# Patient Record
Sex: Male | Born: 1935 | Race: Black or African American | Marital: Single | Smoking: Never smoker
Health system: Southern US, Community
[De-identification: ages and names within clinical notes are randomized; demographics above are authoritative.]

---

## 2012-03-06 ENCOUNTER — Ambulatory Visit
Admission: RE | Admit: 2012-03-06 | Discharge: 2012-03-06 | Disposition: A | Discharge status: Home / Self Care | Payer: PRIVATE HEALTH INSURANCE | Source: Ambulatory Visit | Attending: Family Medicine | Admitting: Family Medicine

## 2012-03-06 ENCOUNTER — Other Ambulatory Visit: Payer: Self-pay | Admitting: Family Medicine

## 2012-03-06 DIAGNOSIS — R630 Anorexia: Secondary | ICD-10-CM

## 2012-03-06 DIAGNOSIS — R05 Cough: Secondary | ICD-10-CM

## 2015-03-19 ENCOUNTER — Emergency Department (HOSPITAL_COMMUNITY)
Admission: EM | Admit: 2015-03-19 | Discharge: 2015-03-19 | Disposition: A | Discharge status: Home / Self Care | Payer: Medicare HMO | Attending: Emergency Medicine | Admitting: Emergency Medicine

## 2015-03-19 ENCOUNTER — Encounter (HOSPITAL_COMMUNITY): Payer: Self-pay | Admitting: Emergency Medicine

## 2015-03-19 DIAGNOSIS — R630 Anorexia: Secondary | ICD-10-CM | POA: Insufficient documentation

## 2015-03-19 DIAGNOSIS — R634 Abnormal weight loss: Secondary | ICD-10-CM | POA: Diagnosis not present

## 2015-03-19 DIAGNOSIS — Z87891 Personal history of nicotine dependence: Secondary | ICD-10-CM | POA: Insufficient documentation

## 2015-03-19 DIAGNOSIS — R319 Hematuria, unspecified: Secondary | ICD-10-CM | POA: Diagnosis present

## 2015-03-19 DIAGNOSIS — R310 Gross hematuria: Secondary | ICD-10-CM | POA: Diagnosis not present

## 2015-03-19 LAB — BASIC METABOLIC PANEL
ANION GAP: 12 (ref 5–15)
BUN: 34 mg/dL — AB (ref 6–20)
CHLORIDE: 105 mmol/L (ref 101–111)
CO2: 28 mmol/L (ref 22–32)
Calcium: 9.5 mg/dL (ref 8.9–10.3)
Creatinine, Ser: 1.4 mg/dL — ABNORMAL HIGH (ref 0.61–1.24)
GFR calc Af Amer: 48 mL/min — ABNORMAL LOW (ref >60)
GFR calc non Af Amer: 42 mL/min — ABNORMAL LOW (ref >60)
GLUCOSE: 108 mg/dL — AB (ref 65–99)
POTASSIUM: 3.2 mmol/L — AB (ref 3.5–5.1)
SODIUM: 145 mmol/L (ref 135–145)

## 2015-03-19 LAB — URINE MICROSCOPIC-ADD ON: Squamous Epithelial / LPF: NONE SEEN

## 2015-03-19 LAB — CBC WITH DIFFERENTIAL/PLATELET
Basophils Absolute: 0 10*3/uL (ref 0.0–0.1)
Basophils Relative: 0 %
Eosinophils Absolute: 0 10*3/uL (ref 0.0–0.7)
Eosinophils Relative: 0 %
HCT: 33.8 % — ABNORMAL LOW (ref 39.0–52.0)
HEMOGLOBIN: 11.3 g/dL — AB (ref 13.0–17.0)
LYMPHS ABS: 1.2 10*3/uL (ref 0.7–4.0)
LYMPHS PCT: 28 %
MCH: 31.9 pg (ref 26.0–34.0)
MCHC: 33.4 g/dL (ref 30.0–36.0)
MCV: 95.5 fL (ref 78.0–100.0)
Monocytes Absolute: 0.4 10*3/uL (ref 0.1–1.0)
Monocytes Relative: 9 %
NEUTROS PCT: 63 %
Neutro Abs: 2.7 10*3/uL (ref 1.7–7.7)
Platelets: 227 10*3/uL (ref 150–400)
RBC: 3.54 MIL/uL — AB (ref 4.22–5.81)
RDW: 14.6 % (ref 11.5–15.5)
WBC: 4.2 10*3/uL (ref 4.0–10.5)

## 2015-03-19 LAB — URINALYSIS, ROUTINE W REFLEX MICROSCOPIC
Glucose, UA: NEGATIVE mg/dL
Ketones, ur: NEGATIVE mg/dL
Nitrite: NEGATIVE
Protein, ur: NEGATIVE mg/dL
Specific Gravity, Urine: 1.022 (ref 1.005–1.030)
pH: 5 (ref 5.0–8.0)

## 2015-03-19 NOTE — ED Notes (Signed)
Pt states he got up this morning to get a glass of water and urinate and when he voided it was bloody

## 2015-03-19 NOTE — ED Provider Notes (Signed)
CSN: [id number]     Arrival date & time 03/19/15  0315 History  By signing my name below, I, Adrian Estrada, attest that this documentation has been prepared under the direction and in the presence of Derwood Kaplan, MD. Electronically Signed: [name], ED Scribe. 03/19/2015. 5:27 AM.    Chief Complaint  Patient presents with  . Hematuria   The history is provided by the patient.   HPI Comments: Adrian Estrada is a 81 y.o. male who presents to the Emergency Department complaining of an episode of hematuria that occurred several hours PTA. He reports associated weight loss of approx 20 pounds in the past several months and increase eye puffiness. However, he attributes these symptoms to stress and loss of appetite since his wife has lost her eyesight. He denies any burning while urination, frequency, or dysuria. He reports that he had a normal day, digging and even bowling prior to onset. He denies any medical  hx. He reports that he does not have a PCP but has a physical every 2 years from his wife's doctor. No known prostate problems.    History reviewed. No pertinent past medical history. History reviewed. No pertinent past surgical history. History reviewed. No pertinent family history. Social History  Substance Use Topics  . Smoking status: Former Games developer  . Smokeless tobacco: None  . Alcohol Use: No    Review of Systems   ROS 10 Systems reviewed and are negative for acute change except as noted in the HPI.     Allergies  Review of patient's allergies indicates no known allergies.  Home Medications   Prior to Admission medications   Not on File   BP 151/88 mmHg  Pulse 90  Temp(Src) 97.4 F (36.3 C) (Oral)  Resp 17  SpO2 99% Physical Exam  Constitutional: He is oriented to person, place, and time. He appears well-developed and well-nourished. No distress.  HENT:  Head: Normocephalic and atraumatic.  Eyes: Conjunctivae and EOM are normal.  Neck: Neck  supple. No tracheal deviation present.  Cardiovascular: Normal rate.   Pulmonary/Chest: Effort normal. No respiratory distress.  Abdominal: Soft.  Musculoskeletal: Normal range of motion.  No midline spine tenderness No flank TTP  Neurological: He is alert and oriented to person, place, and time.  Skin: Skin is warm and dry.  Psychiatric: He has a normal mood and affect. His behavior is normal.  Nursing note and vitals reviewed.   ED Course  Procedures (including critical care time) Labs Review Labs Reviewed  URINALYSIS, ROUTINE W REFLEX MICROSCOPIC (NOT AT [facility]) - Abnormal; Notable for the following:    APPearance CLOUDY (*)    Hgb urine dipstick LARGE (*)    Bilirubin Urine SMALL (*)    Leukocytes, UA SMALL (*)    All other components within normal limits  CBC WITH DIFFERENTIAL/PLATELET - Abnormal; Notable for the following:    RBC 3.54 (*)    Hemoglobin 11.3 (*)    HCT 33.8 (*)    All other components within normal limits  BASIC METABOLIC PANEL - Abnormal; Notable for the following:    Potassium 3.2 (*)    Glucose, Bld 108 (*)    BUN 34 (*)    Creatinine, Ser 1.40 (*)    GFR calc non Af Amer 42 (*)    GFR calc Af Amer 48 (*)    All other components within normal limits  URINE MICROSCOPIC-ADD ON - Abnormal; Notable for the following:    Bacteria, UA FEW (*)  All other components within normal limits  URINE CULTURE    Imaging Review No results found. I have personally reviewed and evaluated these images and lab results as part of my medical decision-making.   EKG Interpretation None      MDM   Final diagnoses:  Gross hematuria    Pt comes in with gross hematuria. He is [age], has no medical problems. Will check for infection.  He has no back pain, but does indicate that he has some weight loss. CBC and BMP ordered to ensure kidney function is fine and Hb is normal - and if the US is clear, we will give Urology f/u for further evaluation of  hematuria.     Derwood KaplanAnkit Kongmeng Santoro, MD 03/19/15 [phone]

## 2015-03-19 NOTE — Discharge Instructions (Signed)
Please see the Urologist as requested for the bloody urine, so that they can do further investigation looking for the cause of your symptoms.  Please return to the ER if your symptoms worsen; you have increased pain, fevers, chills, inability to urinate.   Hematuria, Adult Hematuria is blood in your urine. It can be caused by a bladder infection, kidney infection, prostate infection, kidney stone, or cancer of your urinary tract. Infections can usually be treated with medicine, and a kidney stone usually will pass through your urine. If neither of these is the cause of your hematuria, further workup to find out the reason may be needed. It is very important that you tell your health care provider about any blood you see in your urine, even if the blood stops without treatment or happens without causing pain. Blood in your urine that happens and then stops and then happens again can be a symptom of a very serious condition. Also, pain is not a symptom in the initial stages of many urinary cancers. HOME CARE INSTRUCTIONS   Drink lots of fluid, 3-4 quarts a day. If you have been diagnosed with an infection, cranberry juice is especially recommended, in addition to large amounts of water.  Avoid caffeine, tea, and carbonated beverages because they tend to irritate the bladder.  Avoid alcohol because it may irritate the prostate.  Take all medicines as directed by your health care provider.  If you were prescribed an antibiotic medicine, finish it all even if you start to feel better.  If you have been diagnosed with a kidney stone, follow your health care provider's instructions regarding straining your urine to catch the stone.  Empty your bladder often. Avoid holding urine for long periods of time.  After a bowel movement, women should cleanse front to back. Use each tissue only once.  Empty your bladder before and after sexual intercourse if you are a male. SEEK MEDICAL CARE IF:  You  develop back pain.  You have a fever.  You have a feeling of sickness in your stomach (nausea) or vomiting.  Your symptoms are not better in 3 days. Return sooner if you are getting worse. SEEK IMMEDIATE MEDICAL CARE IF:   You develop severe vomiting and are unable to keep the medicine down.  You develop severe back or abdominal pain despite taking your medicines.  You begin passing a large amount of blood or clots in your urine.  You feel extremely weak or faint, or you pass out. MAKE SURE YOU:   Understand these instructions.  Will watch your condition.  Will get help right away if you are not doing well or get worse.   This information is not intended to replace advice given to you by your health care provider. Make sure you discuss any questions you have with your health care provider.   Document Released: 03/20/2005 Document Revised: 04/10/2014 Document Reviewed: 11/18/2012 Elsevier Interactive Patient Education [employer]2016 Elsevier Inc.

## 2015-03-20 LAB — URINE CULTURE: Culture: NO GROWTH

## 2015-08-09 ENCOUNTER — Emergency Department (HOSPITAL_COMMUNITY)
Admission: EM | Admit: 2015-08-09 | Discharge: 2015-08-09 | Disposition: A | Discharge status: Home / Self Care | Payer: Medicare HMO | Attending: Emergency Medicine | Admitting: Emergency Medicine

## 2015-08-09 ENCOUNTER — Encounter (HOSPITAL_COMMUNITY): Payer: Self-pay | Admitting: *Deleted

## 2015-08-09 DIAGNOSIS — B029 Zoster without complications: Secondary | ICD-10-CM | POA: Insufficient documentation

## 2015-08-09 DIAGNOSIS — Z79899 Other long term (current) drug therapy: Secondary | ICD-10-CM | POA: Diagnosis not present

## 2015-08-09 MED ORDER — ACYCLOVIR 400 MG PO TABS: 800.0000 mg | ORAL_TABLET | Freq: Every day | ORAL | Status: AC

## 2015-08-09 NOTE — ED Notes (Signed)
Bed: WA03 Expected date:  Expected time:  Means of arrival:  Comments: 82yr old, Shingles

## 2015-08-09 NOTE — Discharge Instructions (Signed)
You were seen today for a rash. Follow-up with your primary care provider within 2 days regarding your visit to the emergency department today.  Return to the emergency department if you experience fever, chills, uncontrolled pain, nausea, vomiting.  Shingles Shingles, which is also known as herpes zoster, is an infection that causes a painful skin rash and fluid-filled blisters. Shingles is not related to genital herpes, which is a sexually transmitted infection.   Shingles only develops in people who:  Have had chickenpox.  Have received the chickenpox vaccine. (This is rare.) CAUSES Shingles is caused by varicella-zoster virus (VZV). This is the same virus that causes chickenpox. After exposure to VZV, the virus stays in the body in an inactive (dormant) state. Shingles develops if the virus reactivates. This can happen many years after the initial exposure to VZV. It is not known what causes this virus to reactivate. RISK FACTORS People who have had chickenpox or received the chickenpox vaccine are at risk for shingles. Infection is more common in people who:  Are older than age 80.  Have a weakened defense (immune) system, such as those with HIV, AIDS, or cancer.  Are taking medicines that weaken the immune system, such as transplant medicines.  Are under great stress. SYMPTOMS Early symptoms of this condition include itching, tingling, and pain in an area on your skin. Pain may be described as burning, stabbing, or throbbing. A few days or weeks after symptoms start, a painful red rash appears, usually on one side of the body in a bandlike or beltlike pattern. The rash eventually turns into fluid-filled blisters that break open, scab over, and dry up in about 2-3 weeks. At any time during the infection, you may also develop:  A fever.  Chills.  A headache.  An upset stomach. DIAGNOSIS This condition is diagnosed with a skin exam. Sometimes, skin or fluid samples are taken  from the blisters before a diagnosis is made. These samples are examined under a microscope or sent to a lab for testing. TREATMENT There is no specific cure for this condition. Your health care provider will probably prescribe medicines to help you manage pain, recover more quickly, and avoid long-term problems. Medicines may include:  Antiviral drugs.  Anti-inflammatory drugs.  Pain medicines. If the area involved is on your face, you may be referred to a specialist, such as an eye doctor (ophthalmologist) or an ear, nose, and throat (ENT) doctor to help you avoid eye problems, chronic pain, or disability. HOME CARE INSTRUCTIONS Medicines  Take medicines only as directed by your health care provider.  Apply an anti-itch or numbing cream to the affected area as directed by your health care provider. Blister and Rash Care  Take a cool bath or apply cool compresses to the area of the rash or blisters as directed by your health care provider. This may help with pain and itching.  Keep your rash covered with a loose bandage (dressing). Wear loose-fitting clothing to help ease the pain of material rubbing against the rash.  Keep your rash and blisters clean with mild soap and cool water or as directed by your health care provider.  Check your rash every day for signs of infection. These include redness, swelling, and pain that lasts or increases.  Do not pick your blisters.  Do not scratch your rash. General Instructions  Rest as directed by your health care provider.  Keep all follow-up visits as directed by your health care provider. This is important.  Until your blisters scab over, your infection can cause chickenpox in people who have never had it or been vaccinated against it. To prevent this from happening, avoid contact with other people, especially:  Babies.  Pregnant women.  Children who have eczema.  Elderly people who have transplants.  People who have chronic  illnesses, such as leukemia or AIDS. SEEK MEDICAL CARE IF:  Your pain is not relieved with prescribed medicines.  Your pain does not get better after the rash heals.  Your rash looks infected. Signs of infection include redness, swelling, and pain that lasts or increases. SEEK IMMEDIATE MEDICAL CARE IF:  The rash is on your face or nose.  You have facial pain, pain around your eye area, or loss of feeling on one side of your face.  You have ear pain or you have ringing in your ear.  You have loss of taste.  Your condition gets worse.   This information is not intended to replace advice given to you by your health care provider. Make sure you discuss any questions you have with your health care provider.   Document Released: 03/20/2005 Document Revised: 04/10/2014 Document Reviewed: 01/29/2014 Elsevier Interactive Patient Education [employer]

## 2015-08-09 NOTE — ED Provider Notes (Signed)
CSN: [id number]     Arrival date & time 08/09/15  1741 History   First MD Initiated Contact with Patient 08/09/15 1748     Chief Complaint  Patient presents with  . Herpes Zoster     (Consider location/radiation/quality/duration/timing/severity/associated sxs/prior Treatment) HPI   Patient is a 82 year old male with no significant past medical history who presents with a rash for 2 days. He states 3 days ago he began experiencing mild pruritus and tingling of his back and chest and posterior left arm. He states the rash is on his upper left back, posterior left arm, left axilla, and left upper chest. He stated the rash was painful when it first began and now only very mild pain to his back. He has not taking anything for the pain or the rash.  History reviewed. No pertinent past medical history. History reviewed. No pertinent past surgical history. No family history on file. Social History  Substance Use Topics  . Smoking status: Never Smoker   . Smokeless tobacco: None  . Alcohol Use: No    Review of Systems  Constitutional: Negative for fever and chills.  Eyes: Negative for visual disturbance.  Respiratory: Negative for shortness of breath.   Cardiovascular: Negative for chest pain.  Gastrointestinal: Negative for nausea, vomiting and abdominal pain.  Skin: Positive for rash.      Allergies  Review of patient's allergies indicates not on file.  Home Medications   Prior to Admission medications   Medication Sig Start Date End Date Taking? Authorizing Provider  acyclovir (ZOVIRAX) 400 MG tablet Take 2 tablets (800 mg total) by mouth 5 (five) times daily. 08/09/15   Carlitos Bottino L Raeonna Milo, PA   BP 142/84 mmHg  Pulse 98  Temp(Src) 97.5 F (36.4 C) (Oral)  Resp 16  SpO2 100% Physical Exam  Constitutional: Vital signs are normal. No distress.  Very thin but well-appearing elderly man  HENT:  Head: Normocephalic and atraumatic.  Eyes: Conjunctivae are normal.  Cardiovascular:  Normal rate and normal heart sounds.   Pulmonary/Chest: Effort normal and breath sounds normal.  Neurological: He is alert. Coordination normal.  Skin: Rash noted.     Red raised confluent rash with scattered vesicles and scattered excoriated areas, does not cross midline on back or midline on chest appears to be T1, T2, T3 dermatomal distribution.  Psychiatric: He has a normal mood and affect. His behavior is normal.    ED Course  Procedures (including critical care time) Labs Review Labs Reviewed - No data to display  Imaging Review No results found. I have personally reviewed and evaluated these images and lab results as part of my medical decision-making.   EKG Interpretation None      MDM   Final diagnoses:  Shingles rash    Patient with herpes zoster. Patient will be discharged with Acyclovir. Pt states he is not in any pain and does not need pain medication. No signs of secondary infection. No signs of disseminated herpes. Follow up with PCP in 2-3 days. Return precautions discussed. Pt is safe for discharge at this time.           Jerre SimonJessica L Tomica Arseneault, PA 08/09/15 1937  Raeford RazorStephen Kohut, MD 08/13/15 1105

## 2015-08-09 NOTE — ED Notes (Addendum)
Per EMS, pt sent from doctor's for shingles to pectoral and shoulder. Shingles on back are weeping. Pt's doctor is also concerned that pt cannot take care of self and needs home health or assisted living. Pt's daughter's number upon discharge, Clarisa Schoolsatia [phone]. Pt also has friend that helps with patient, Simeon CraftVern [phone].

## 2015-08-10 ENCOUNTER — Encounter (HOSPITAL_COMMUNITY): Payer: Self-pay | Admitting: Emergency Medicine

## 2016-06-20 ENCOUNTER — Emergency Department (HOSPITAL_COMMUNITY)
Admission: EM | Admit: 2016-06-20 | Discharge: 2016-06-21 | Disposition: A | Discharge status: Home / Self Care | Payer: Medicare HMO | Attending: Emergency Medicine | Admitting: Emergency Medicine

## 2016-06-20 ENCOUNTER — Encounter (HOSPITAL_COMMUNITY): Payer: Self-pay | Admitting: *Deleted

## 2016-06-20 ENCOUNTER — Emergency Department (HOSPITAL_COMMUNITY): Payer: Medicare HMO

## 2016-06-20 DIAGNOSIS — S91312A Laceration without foreign body, left foot, initial encounter: Secondary | ICD-10-CM

## 2016-06-20 DIAGNOSIS — Y9301 Activity, walking, marching and hiking: Secondary | ICD-10-CM | POA: Insufficient documentation

## 2016-06-20 DIAGNOSIS — W25XXXA Contact with sharp glass, initial encounter: Secondary | ICD-10-CM | POA: Insufficient documentation

## 2016-06-20 DIAGNOSIS — Z23 Encounter for immunization: Secondary | ICD-10-CM | POA: Insufficient documentation

## 2016-06-20 DIAGNOSIS — Y929 Unspecified place or not applicable: Secondary | ICD-10-CM | POA: Insufficient documentation

## 2016-06-20 DIAGNOSIS — Z79899 Other long term (current) drug therapy: Secondary | ICD-10-CM | POA: Diagnosis not present

## 2016-06-20 DIAGNOSIS — Y999 Unspecified external cause status: Secondary | ICD-10-CM | POA: Diagnosis not present

## 2016-06-20 MED ORDER — CEPHALEXIN 500 MG PO CAPS
500.0000 mg | ORAL_CAPSULE | Freq: Once | ORAL | Status: AC
  Administered 2016-06-21: 500 mg via ORAL
  Filled 2016-06-20: qty 1

## 2016-06-20 MED ORDER — CEPHALEXIN 500 MG PO CAPS
500.0000 mg | ORAL_CAPSULE | Freq: Four times a day (QID) | ORAL | 0 refills | Status: AC
Start: 2016-06-20 — End: ?

## 2016-06-20 MED ORDER — TETANUS-DIPHTH-ACELL PERTUSSIS 5-2.5-18.5 LF-MCG/0.5 IM SUSP
0.5000 mL | Freq: Once | INTRAMUSCULAR | Status: AC
  Administered 2016-06-20: 0.5 mL via INTRAMUSCULAR
  Filled 2016-06-20: qty 0.5

## 2016-06-20 MED ORDER — LIDOCAINE-EPINEPHRINE-TETRACAINE (LET) SOLUTION
3.0000 mL | Freq: Once | NASAL | Status: AC
  Administered 2016-06-20: 3 mL via TOPICAL
  Filled 2016-06-20: qty 3

## 2016-06-20 NOTE — ED Provider Notes (Signed)
[facility]-EMERGENCY DEPT Provider Note   CSN: [id number] Arrival date & time: 06/20/16  2050     History   Chief Complaint Chief Complaint  Patient presents with  . Extremity Laceration    HPI Adrian Estrada is a 83 y.o. male.  HPI   Adrian Estrada is a 83 y.o. male, with a history of Dementia, presenting to the ED with a left foot injury that occurred earlier this evening. Patient's neighbor is at the bedside and states that the patient was walking barefoot and stepped on a piece of glass. Neighbor states he recovered the piece of bloody glass and it appeared to be intact. Patient denies current pain. Unknown tetanus status. Denies numbness, tingling, falls, or any other complaints.      History reviewed. No pertinent past medical history.  There are no active problems to display for this patient.   History reviewed. No pertinent surgical history.     Home Medications    Prior to Admission medications   Medication Sig Start Date End Date Taking? Authorizing Provider  acyclovir (ZOVIRAX) 400 MG tablet Take 2 tablets (800 mg total) by mouth 5 (five) times daily. 08/09/15   Jerre Simon, PA  cephALEXin (KEFLEX) 500 MG capsule Take 1 capsule (500 mg total) by mouth 4 (four) times daily. 06/20/16   Anselm Pancoast, PA-C    Family History No family history on file.  Social History Social History  Substance Use Topics  . Smoking status: Never Smoker  . Smokeless tobacco: Never Used  . Alcohol use No     Allergies   Patient has no known allergies.   Review of Systems Review of Systems  Skin: Positive for wound.  Neurological: Negative for weakness and numbness.     Physical Exam Updated Vital Signs BP 121/67 (BP Location: Right Arm)   Pulse 84   Resp 18   SpO2 99%   Physical Exam  Constitutional: He appears well-developed and well-nourished. No distress.  HENT:  Head: Normocephalic and atraumatic.  Eyes: Conjunctivae are normal.  Neck: Neck supple.   Cardiovascular: Normal rate and regular rhythm.   Pulmonary/Chest: Effort normal.  Musculoskeletal: He exhibits no edema.  Full range of motion in the left foot and ankle.  Neurological: He is alert.  Skin: Skin is warm and dry. He is not diaphoretic.  2 cm semi-crescent laceration to the lateral sole of the left foot. No foreign bodies noted under the skin. Bleeding is controlled.  Psychiatric: He has a normal mood and affect. His behavior is normal.  Nursing note and vitals reviewed.    ED Treatments / Results  Labs (all labs ordered are listed, but only abnormal results are displayed) Labs Reviewed - No data to display  EKG  EKG Interpretation None       Radiology No results found.  Procedures .Marland KitchenLaceration Repair Date/Time: 06/21/2016 12:35 AM Performed by: Anselm Pancoast Authorized by: Anselm Pancoast   Consent:    Consent obtained:  Verbal   Consent given by:  Patient   Risks discussed:  Infection, need for additional repair, pain, poor wound healing and retained foreign body Anesthesia (see MAR for exact dosages):    Anesthesia method:  Topical application and local infiltration   Topical anesthetic:  LET   Local anesthetic:  Lidocaine 2% w/o epi Laceration details:    Location:  Foot   Foot location:  Sole of L foot   Length (cm):  2 Repair type:    Repair  type:  Simple Pre-procedure details:    Preparation:  Patient was prepped and draped in usual sterile fashion Exploration:    Hemostasis achieved with:  LET   Wound exploration: wound explored through full range of motion   Treatment:    Area cleansed with:  Betadine and saline   Amount of cleaning:  Extensive   Irrigation solution:  Sterile saline   Irrigation method:  Syringe Skin repair:    Repair method:  Sutures   Suture size:  4-0   Suture material:  Prolene   Suture technique:  Simple interrupted   Number of sutures:  4 Approximation:    Approximation:  Close Post-procedure details:     Dressing:  Sterile dressing (Post-op shoe for protection)   Patient tolerance of procedure:  Tolerated well, no immediate complications     (including critical care time)  Medications Ordered in ED Medications  lidocaine-EPINEPHrine-tetracaine (LET) solution (3 mLs Topical Given 06/20/16 2328)  Tdap (BOOSTRIX) injection 0.5 mL (0.5 mLs Intramuscular Given 06/20/16 2339)  cephALEXin (KEFLEX) capsule 500 mg (500 mg Oral Given 06/21/16 0013)     Initial Impression / Assessment and Plan / ED Course  I have reviewed the triage vital signs and the nursing notes.  Pertinent labs & imaging results that were available during my care of the patient were reviewed by me and considered in my medical decision making (see chart for details).     Patient presents with a laceration to the sole of the foot. Repaired without immediate complication. Postop shoe given for protection. Antibiotics prescribed due to the location of the wound, the potential for contamination, and the possibility for delayed or poor follow up. Wound care and return precautions discussed. Patient's neighbor states he is accepting the care instructions on behalf of the patient's family. Neighbor voices understanding of all instructions.    Final Clinical Impressions(s) / ED Diagnoses   Final diagnoses:  Laceration of left foot, initial encounter    New Prescriptions Discharge Medication List as of 06/21/2016 12:58 AM    START taking these medications   Details  cephALEXin (KEFLEX) 500 MG capsule Take 1 capsule (500 mg total) by mouth 4 (four) times daily., Starting Tue 06/20/2016, Print         Anselm PancoastShawn C Marjan Rosman, PA-C 06/24/16 0134    Lorre NickAnthony Allen, MD 06/25/16 1430

## 2016-06-20 NOTE — ED Triage Notes (Signed)
Pt has laceration to bottom of foot since stepping on glass tonight. Pt denies pain. Bleeding controlled.

## 2016-06-20 NOTE — Discharge Instructions (Addendum)
Remove the bandage after 24 hours. You must wait at least 8 hours after the wound repair to wash the wound. Clean the wound and surrounding area gently with tap water and mild soap. Rinse well and blot dry. Do not scrub the wound, as this may cause the wound edges to come apart. You may shower, but avoid submerging the wound, such as with a bath or swimming. Clean the wound daily to prevent infection. Do not use cleaners such as hydrogen peroxide or alcohol. Reapplication of a topical antibiotic ointment, such as Neosporin, will decrease scab formation and reduce any scarring. You may use Tylenol, naproxen, or ibuprofen for pain.  Go to your primary care provider in about 3 days for a wound check to assure proper healing. Return to the ED in 12-14 days for suture removal.  Return to the ED sooner should the wound edges come apart or signs of infection arise, such as spreading redness, puffiness/swelling, pus draining from the wound, severe increase in pain, or any other major issues.  Please take all of your antibiotics until finished!   You may develop abdominal discomfort or diarrhea from the antibiotic.  You may help offset this with probiotics which you can buy or get in yogurt. Do not eat or take the probiotics until 2 hours after your antibiotic.

## 2016-07-05 ENCOUNTER — Encounter (HOSPITAL_COMMUNITY): Payer: Self-pay | Admitting: *Deleted

## 2016-07-05 ENCOUNTER — Emergency Department (HOSPITAL_COMMUNITY)
Admission: EM | Admit: 2016-07-05 | Discharge: 2016-07-05 | Disposition: A | Discharge status: Home / Self Care | Payer: Medicare HMO | Attending: Emergency Medicine | Admitting: Emergency Medicine

## 2016-07-05 DIAGNOSIS — Z79899 Other long term (current) drug therapy: Secondary | ICD-10-CM | POA: Insufficient documentation

## 2016-07-05 DIAGNOSIS — Z4802 Encounter for removal of sutures: Secondary | ICD-10-CM | POA: Diagnosis not present

## 2016-07-05 NOTE — ED Provider Notes (Signed)
[facility]-EMERGENCY DEPT Provider Note   CSN: [id number] Arrival date & time: 07/05/16  1421  By signing my name below, I, Modena Jansky, attest that this documentation has been prepared under the direction and in the presence of non-physician practitioner, [address] PA-C. Electronically Signed: Modena Jansky, Scribe. 07/05/2016. 3:07 PM.  History   Chief Complaint Chief Complaint  Patient presents with  . Suture / Staple Removal   The history is provided by the patient and medical records. No language interpreter was used.  Suture / Staple Removal  This is a new problem. The current episode started more than 1 week ago. The problem occurs constantly. The problem has been gradually improving. Pertinent negatives include no chest pain, no abdominal pain and no shortness of breath. Nothing aggravates the symptoms. Nothing relieves the symptoms. He has tried nothing for the symptoms. The treatment provided no relief.   HPI Comments: Adrian Estrada is a 83 y.o. male who presents to the Emergency Department for left foot suture removal. He had stiches placed on the bottom of his left foot on 06/20/16. He stepped on a piece of glass while ambulating barefoot. No current concerns. Denies fevers, chills, drainage, warmth, swelling, redness, red streaking, or other issues with the wound. Also denies CP, SOB, abd pain, N/V/D/C, hematuria, dysuria, myalgias, arthralgias, numbness, tingling, focal weakness, or any other complaints at this time.  PCP: Karie Chimera, MD  History reviewed. No pertinent past medical history.  There are no active problems to display for this patient.   History reviewed. No pertinent surgical history.     Home Medications    Prior to Admission medications   Medication Sig Start Date End Date Taking? Authorizing Provider  acyclovir (ZOVIRAX) 400 MG tablet Take 2 tablets (800 mg total) by mouth 5 (five) times daily. 08/09/15   Jerre Simon, PA  cephALEXin  (KEFLEX) 500 MG capsule Take 1 capsule (500 mg total) by mouth 4 (four) times daily. 06/20/16   Anselm Pancoast, PA-C    Family History No family history on file.  Social History Social History  Substance Use Topics  . Smoking status: Never Smoker  . Smokeless tobacco: Never Used  . Alcohol use No     Allergies   Patient has no known allergies.   Review of Systems Review of Systems  Constitutional: Negative for chills and fever.  Respiratory: Negative for shortness of breath.   Cardiovascular: Negative for chest pain.  Gastrointestinal: Negative for abdominal pain, constipation, diarrhea, nausea and vomiting.  Genitourinary: Negative for dysuria and hematuria.  Musculoskeletal: Negative for arthralgias and myalgias.  Skin: Positive for wound (suture removal). Negative for color change.  Allergic/Immunologic: Negative for immunocompromised state.  Neurological: Negative for weakness and numbness.  Psychiatric/Behavioral: Negative for confusion.  10 Systems reviewed and all are negative for acute change except as noted in the HPI.  Physical Exam Updated Vital Signs BP (!) 141/80 (BP Location: Left Arm)   Pulse 88   Temp 98.2 F (36.8 C) (Oral)   Resp 16   SpO2 95%   Physical Exam  Constitutional: He is oriented to person, place, and time. Vital signs are normal. He appears well-developed and well-nourished.  Non-toxic appearance. No distress.  Afebrile, nontoxic, NAD  HENT:  Head: Normocephalic and atraumatic.  Mouth/Throat: Mucous membranes are normal.  Eyes: Conjunctivae and EOM are normal. Right eye exhibits no discharge. Left eye exhibits no discharge.  Neck: Normal range of motion. Neck supple.  Cardiovascular: Normal rate and intact  distal pulses.   Pulmonary/Chest: Effort normal. No respiratory distress.  Abdominal: Normal appearance. He exhibits no distension.  Musculoskeletal: Normal range of motion.  Neurological: He is alert and oriented to person, place, and  time. He has normal strength. No sensory deficit.  Skin: Skin is warm, dry and intact. No rash noted.  Sole of left foot with 4 sutures intact, well-healed wound, no drainage or erythema, no warmth or swelling, wiggles all digits, strength and sensations grossly intact, distal pulses intact, soft compartments.   Psychiatric: He has a normal mood and affect.  Nursing note and vitals reviewed.    ED Treatments / Results  DIAGNOSTIC STUDIES: Oxygen Saturation is 95% on RA, normal by my interpretation.    COORDINATION OF CARE: 3:11 PM- Pt advised of plan for treatment and pt agrees.  Labs (all labs ordered are listed, but only abnormal results are displayed) Labs Reviewed - No data to display  EKG  EKG Interpretation None       Radiology No results found.  Procedures .Suture Removal Date/Time: 07/05/2016 3:16 PM Performed by: Rhona Raider Authorized by: Rhona Raider   Consent:    Consent obtained:  Verbal   Consent given by:  Patient   Risks discussed:  Pain and wound separation   Alternatives discussed:  Alternative treatment and referral Location:    Location:  Lower extremity   Lower extremity location:  Foot   Foot location:  L foot Procedure details:    Wound appearance:  No signs of infection, good wound healing and clean   Number of sutures removed:  4   Number of staples removed:  4 Post-procedure details:    Patient tolerance of procedure:  Tolerated well, no immediate complications   (including critical care time)  Medications Ordered in ED Medications - No data to display   Initial Impression / Assessment and Plan / ED Course  I have reviewed the triage vital signs and the nursing notes.  Pertinent labs & imaging results that were available during my care of the patient were reviewed by me and considered in my medical decision making (see chart for details).     83 y.o. male here for suture removal, sutures placed in L foot on 06/20/16, good  wound healing, no s/sx of infection, NVI w/soft compartments. 4 sutures removed without incident. Wound care advised, f/up with PCP in 1wk for recheck of wound. I explained the diagnosis and have given explicit precautions to return to the ER including for any other new or worsening symptoms. The patient understands and accepts the medical plan as it's been dictated and I have answered their questions. Discharge instructions concerning home care and prescriptions have been given. The patient is STABLE and is discharged to home in good condition.   I personally performed the services described in this documentation, which was scribed in my presence. The recorded information has been reviewed and is accurate.   Final Clinical Impressions(s) / ED Diagnoses   Final diagnoses:  Visit for suture removal    New Prescriptions New Prescriptions   No medications on file     [address], PA-C 07/05/16 1518    Vanetta Mulders, MD 07/05/16 1800

## 2016-07-05 NOTE — ED Notes (Signed)
Bed: WTR8 Expected date:  Expected time:  Means of arrival:  Comments: 

## 2016-07-05 NOTE — Discharge Instructions (Signed)
Keep wound clean with mild soap and water. Keep area covered with a topical antibiotic ointment and bandage until the scab fully heals. Ice and elevate for any pain relief and swelling. Alternate between Ibuprofen and Tylenol for additional pain relief. Follow up with your primary care doctor in approximately 7 days for recheck. Monitor area for signs of infection to include, but not limited to: increasing pain, spreading redness, drainage/pus, worsening swelling, or fevers. Return to emergency department for emergent changing or worsening symptoms.

## 2016-07-05 NOTE — ED Notes (Signed)
Bed: [facility] Expected date:  Expected time:  Means of arrival:  Comments: 

## 2016-07-05 NOTE — ED Triage Notes (Signed)
Pt here for suture removal from bottom of left foot. Pt has had stitches since 3/20. Pt denies any signs of infection to suture site.

## 2016-09-30 ENCOUNTER — Emergency Department (HOSPITAL_COMMUNITY)
Admission: EM | Admit: 2016-09-30 | Discharge: 2016-09-30 | Disposition: A | Discharge status: Home / Self Care | Payer: Medicare HMO | Attending: Emergency Medicine | Admitting: Emergency Medicine

## 2016-09-30 DIAGNOSIS — R404 Transient alteration of awareness: Secondary | ICD-10-CM | POA: Insufficient documentation

## 2016-09-30 DIAGNOSIS — E860 Dehydration: Secondary | ICD-10-CM | POA: Insufficient documentation

## 2016-09-30 DIAGNOSIS — D649 Anemia, unspecified: Secondary | ICD-10-CM | POA: Diagnosis not present

## 2016-09-30 DIAGNOSIS — R55 Syncope and collapse: Secondary | ICD-10-CM | POA: Diagnosis present

## 2016-09-30 LAB — CBC WITH DIFFERENTIAL/PLATELET
BASOS ABS: 0 10*3/uL (ref 0.0–0.1)
BASOS PCT: 0 %
Eosinophils Absolute: 0 10*3/uL (ref 0.0–0.7)
Eosinophils Relative: 1 %
HEMATOCRIT: 27.2 % — AB (ref 39.0–52.0)
HEMOGLOBIN: 9 g/dL — AB (ref 13.0–17.0)
Lymphocytes Relative: 19 %
Lymphs Abs: 0.7 10*3/uL (ref 0.7–4.0)
MCH: 31.4 pg (ref 26.0–34.0)
MCHC: 33.1 g/dL (ref 30.0–36.0)
MCV: 94.8 fL (ref 78.0–100.0)
Monocytes Absolute: 0.4 10*3/uL (ref 0.1–1.0)
Monocytes Relative: 11 %
NEUTROS ABS: 2.7 10*3/uL (ref 1.7–7.7)
Neutrophils Relative %: 69 %
Platelets: 164 10*3/uL (ref 150–400)
RBC: 2.87 MIL/uL — ABNORMAL LOW (ref 4.22–5.81)
RDW: 15.5 % (ref 11.5–15.5)
WBC: 3.9 10*3/uL — ABNORMAL LOW (ref 4.0–10.5)

## 2016-09-30 LAB — BASIC METABOLIC PANEL
ANION GAP: 6 (ref 5–15)
BUN: 18 mg/dL (ref 6–20)
CHLORIDE: 113 mmol/L — AB (ref 101–111)
CO2: 25 mmol/L (ref 22–32)
Calcium: 8.3 mg/dL — ABNORMAL LOW (ref 8.9–10.3)
Creatinine, Ser: 1.32 mg/dL — ABNORMAL HIGH (ref 0.61–1.24)
GFR calc non Af Amer: 44 mL/min — ABNORMAL LOW (ref >60)
GFR, EST AFRICAN AMERICAN: 52 mL/min — AB (ref >60)
Glucose, Bld: 115 mg/dL — ABNORMAL HIGH (ref 65–99)
Potassium: 3.2 mmol/L — ABNORMAL LOW (ref 3.5–5.1)
Sodium: 144 mmol/L (ref 135–145)

## 2016-09-30 LAB — I-STAT CHEM 8, ED
BUN: 20 mg/dL (ref 6–20)
CREATININE: 1.2 mg/dL (ref 0.61–1.24)
Calcium, Ion: 1.17 mmol/L (ref 1.15–1.40)
Chloride: 108 mmol/L (ref 101–111)
Glucose, Bld: 105 mg/dL — ABNORMAL HIGH (ref 65–99)
HEMATOCRIT: 24 % — AB (ref 39.0–52.0)
HEMOGLOBIN: 8.2 g/dL — AB (ref 13.0–17.0)
Potassium: 3.1 mmol/L — ABNORMAL LOW (ref 3.5–5.1)
Sodium: 146 mmol/L — ABNORMAL HIGH (ref 135–145)
TCO2: 27 mmol/L (ref 0–100)

## 2016-09-30 MED ORDER — SODIUM CHLORIDE 0.9 % IV BOLUS (SEPSIS)
1000.0000 mL | Freq: Once | INTRAVENOUS | Status: AC
  Administered 2016-09-30: 1000 mL via INTRAVENOUS

## 2016-09-30 NOTE — ED Triage Notes (Signed)
Patient was out in his yard, he yelled and lay on the ground.  FD reports patient was hypotensive.  EMS BP - 113/80.  200 ml fluid bolus per EMS.  EMS reports that patient has done this before.  GCS on EMS arrival - 9.  [facility]CS enroute to ED - 15. IV placed in left AC by EMS.

## 2016-09-30 NOTE — ED Provider Notes (Signed)
[facility]-EMERGENCY DEPT Provider Note   CSN: [id number] Arrival date & time: 09/30/16  1546     History   Chief Complaint Chief Complaint  Patient presents with  . Loss of Consciousness    HPI Adrian Estrada is a 83 y.o. male.  83 yo M with a chief complaint of a syncopal event. The family states for the past year he's been having bouts of paranoia as well as intermittent confusion and anger. The patient has had a syncopal event today. Per the family he was out in the yard and standing behind a tree healing that someone was coming to get him. When they took him back to the house he became weak and went to the ground. He was in assisted fall and there is no injury per the family. There is no focal shaking or eye deviation. He was unresponsive for approximately 2 minutes. Not confused on arousal. This is the second one of these events. He had one about a month ago at the funeral of his wife. They deny any change in diet any new medications. Denies fevers chills abdominal pain vomiting or diarrhea.   The history is provided by the patient, a relative and a friend.  Loss of Consciousness   This is a recurrent problem. The current episode started less than 1 hour ago. The problem occurs rarely. The problem has been resolved. He lost consciousness for a period of 1 to 5 minutes. The problem is associated with normal activity. Pertinent negatives include abdominal pain, chest pain, confusion, congestion, fever, headaches, palpitations and vomiting. He has tried nothing for the symptoms. The treatment provided no relief.    No past medical history on file.  There are no active problems to display for this patient.   No past surgical history on file.     Home Medications    Prior to Admission medications   Medication Sig Start Date End Date Taking? Authorizing Provider  acyclovir (ZOVIRAX) 400 MG tablet Take 2 tablets (800 mg total) by mouth 5 (five) times daily. Patient not taking:  Reported on 09/30/2016 08/09/15   Jerre Simon, PA  cephALEXin (KEFLEX) 500 MG capsule Take 1 capsule (500 mg total) by mouth 4 (four) times daily. Patient not taking: Reported on 09/30/2016 06/20/16   Anselm Pancoast, PA-C    Family History No family history on file.  Social History Social History  Substance Use Topics  . Smoking status: Never Smoker  . Smokeless tobacco: Never Used  . Alcohol use No     Allergies   Patient has no known allergies.   Review of Systems Review of Systems  Constitutional: Positive for activity change. Negative for chills and fever.  HENT: Negative for congestion and facial swelling.   Eyes: Negative for discharge and visual disturbance.  Respiratory: Negative for shortness of breath.   Cardiovascular: Positive for syncope. Negative for chest pain and palpitations.  Gastrointestinal: Negative for abdominal pain, diarrhea and vomiting.  Musculoskeletal: Negative for arthralgias and myalgias.  Skin: Negative for color change and rash.  Neurological: Positive for syncope. Negative for tremors and headaches.  Psychiatric/Behavioral: Negative for confusion and dysphoric mood.     Physical Exam Updated Vital Signs BP 122/66 (BP Location: Right Arm)   Pulse 84   Resp (!) 21   SpO2 97%   Physical Exam  Constitutional: He is oriented to person, place, and time. He appears cachectic.  HENT:  Head: Normocephalic and atraumatic.  Eyes: EOM are normal. Pupils are  equal, round, and reactive to light.  Neck: Normal range of motion. Neck supple. No JVD present.  Cardiovascular: Normal rate and regular rhythm.  Exam reveals no gallop and no friction rub.   No murmur heard. Pulmonary/Chest: No respiratory distress. He has no wheezes.  Abdominal: He exhibits no distension and no mass. There is no tenderness. There is no rebound and no guarding.  Musculoskeletal: Normal range of motion.  Neurological: He is alert and oriented to person, place, and time.    Skin: No rash noted. No pallor.  Psychiatric: He has a normal mood and affect. His behavior is normal.  Nursing note and vitals reviewed.    ED Treatments / Results  Labs (all labs ordered are listed, but only abnormal results are displayed) Labs Reviewed  CBC WITH DIFFERENTIAL/PLATELET - Abnormal; Notable for the following:       Result Value   WBC 3.9 (*)    RBC 2.87 (*)    Hemoglobin 9.0 (*)    HCT 27.2 (*)    All other components within normal limits  BASIC METABOLIC PANEL - Abnormal; Notable for the following:    Potassium 3.2 (*)    Chloride 113 (*)    Glucose, Bld 115 (*)    Creatinine, Ser 1.32 (*)    Calcium 8.3 (*)    GFR calc non Af Amer 44 (*)    GFR calc Af Amer 52 (*)    All other components within normal limits  I-STAT CHEM 8, ED - Abnormal; Notable for the following:    Sodium 146 (*)    Potassium 3.1 (*)    Glucose, Bld 105 (*)    Hemoglobin 8.2 (*)    HCT 24.0 (*)    All other components within normal limits    EKG  EKG Interpretation  Date/Time:  Saturday September 30 2016 15:46:55 EDT Ventricular Rate:  86 PR Interval:    QRS Duration: 78 QT Interval:  378 QTC Calculation: 453 R Axis:   66 Text Interpretation:  Sinus rhythm Atrial premature complex Anteroseptal infarct, age indeterminate No STEMI. No old tracing for comparison.  Confirmed by Alona BeneLong, Joshua [phone]([id number]) on 09/30/2016 3:55:59 PM       Radiology No results found.  Procedures Procedures (including critical care time)  Medications Ordered in ED Medications  sodium chloride 0.9 % bolus 1,000 mL (0 mLs Intravenous Stopped 09/30/16 1713)     Initial Impression / Assessment and Plan / ED Course  I have reviewed the triage vital signs and the nursing notes.  Pertinent labs & imaging results that were available during my care of the patient were reviewed by me and considered in my medical decision making (see chart for details).     83 yo M With a chief complaint of a syncopal event.  On my arrival to the room the patient is noncommunicative. Family states that this been going on for the past year he left bouts where he is at his baseline and then will be nonresponsive. Or angry. When I talked to the patient he told me that he had nothing to offer me. He didn't provide any further history. I discussed possible causes of these symptoms the family. Currently they feel like he is much better wanting to just take him home. I talked him into doing some basic labs. We'll give them neurology follow-up in case this is subclinical seizures.   Workup with anemia slightly worsening since 2 years ago. Family denies dark stool or blood  in the stool. Declining rectal exam at this time. PCP follow-up early next week.  6:29 PM:  I have discussed the diagnosis/risks/treatment options with the patient and family and believe the pt to be eligible for discharge home to follow-up with PCP. We also discussed returning to the ED immediately if new or worsening sx occur. We discussed the sx which are most concerning (e.g., sudden worsening pain, fever, inability to tolerate by mouth) that necessitate immediate return. Medications administered to the patient during their visit and any new prescriptions provided to the patient are listed below.  Medications given during this visit Medications  sodium chloride 0.9 % bolus 1,000 mL (0 mLs Intravenous Stopped 09/30/16 1713)     The patient appears reasonably screen and/or stabilized for discharge and I doubt any other medical condition or other [facility] requiring further screening, evaluation, or treatment in the ED at this time prior to discharge.   Final Clinical Impressions(s) / ED Diagnoses   Final diagnoses:  Syncope and collapse  Anemia, unspecified type  Dehydration  Altered awareness, transient    New Prescriptions Discharge Medication List as of 09/30/2016  5:57 PM       Melene Plan, DO 09/30/16 1829

## 2016-10-02 ENCOUNTER — Encounter: Payer: Self-pay | Admitting: Neurology

## 2016-11-20 ENCOUNTER — Ambulatory Visit: Payer: Self-pay | Admitting: Neurology

## 2017-01-29 ENCOUNTER — Ambulatory Visit: Payer: Self-pay | Admitting: Neurology

## 2017-03-25 ENCOUNTER — Emergency Department (HOSPITAL_COMMUNITY)
Admission: EM | Admit: 2017-03-25 | Discharge: 2017-03-25 | Disposition: A | Discharge status: Home / Self Care | Payer: Medicare HMO | Attending: Emergency Medicine | Admitting: Emergency Medicine

## 2017-03-25 ENCOUNTER — Encounter (HOSPITAL_COMMUNITY): Payer: Self-pay | Admitting: Emergency Medicine

## 2017-03-25 ENCOUNTER — Emergency Department (HOSPITAL_COMMUNITY): Payer: Medicare HMO

## 2017-03-25 DIAGNOSIS — R4182 Altered mental status, unspecified: Secondary | ICD-10-CM | POA: Diagnosis not present

## 2017-03-25 LAB — COMPREHENSIVE METABOLIC PANEL
ALBUMIN: 3.3 g/dL — AB (ref 3.5–5.0)
ALK PHOS: 75 U/L (ref 38–126)
ALT: 13 U/L — AB (ref 17–63)
ANION GAP: 11 (ref 5–15)
AST: 24 U/L (ref 15–41)
BILIRUBIN TOTAL: 1 mg/dL (ref 0.3–1.2)
BUN: 22 mg/dL — AB (ref 6–20)
CALCIUM: 8.7 mg/dL — AB (ref 8.9–10.3)
CO2: 27 mmol/L (ref 22–32)
CREATININE: 1.45 mg/dL — AB (ref 0.61–1.24)
Chloride: 105 mmol/L (ref 101–111)
GFR calc Af Amer: 46 mL/min — ABNORMAL LOW (ref >60)
GFR calc non Af Amer: 39 mL/min — ABNORMAL LOW (ref >60)
GLUCOSE: 63 mg/dL — AB (ref 65–99)
Potassium: 3 mmol/L — ABNORMAL LOW (ref 3.5–5.1)
SODIUM: 143 mmol/L (ref 135–145)
TOTAL PROTEIN: 6.1 g/dL — AB (ref 6.5–8.1)

## 2017-03-25 LAB — CBC WITH DIFFERENTIAL/PLATELET
BASOS PCT: 0 %
Basophils Absolute: 0 10*3/uL (ref 0.0–0.1)
EOS PCT: 1 %
Eosinophils Absolute: 0 10*3/uL (ref 0.0–0.7)
HEMATOCRIT: 32.1 % — AB (ref 39.0–52.0)
Hemoglobin: 10.5 g/dL — ABNORMAL LOW (ref 13.0–17.0)
Lymphocytes Relative: 31 %
Lymphs Abs: 1.1 10*3/uL (ref 0.7–4.0)
MCH: 31.4 pg (ref 26.0–34.0)
MCHC: 32.7 g/dL (ref 30.0–36.0)
MCV: 96.1 fL (ref 78.0–100.0)
MONO ABS: 0.3 10*3/uL (ref 0.1–1.0)
MONOS PCT: 7 %
NEUTROS ABS: 2.1 10*3/uL (ref 1.7–7.7)
Neutrophils Relative %: 61 %
PLATELETS: 190 10*3/uL (ref 150–400)
RBC: 3.34 MIL/uL — ABNORMAL LOW (ref 4.22–5.81)
RDW: 14.8 % (ref 11.5–15.5)
WBC: 3.4 10*3/uL — ABNORMAL LOW (ref 4.0–10.5)

## 2017-03-25 LAB — TSH: TSH: 3.284 u[IU]/mL (ref 0.350–4.500)

## 2017-03-25 LAB — PROTIME-INR
INR: 1.07
PROTHROMBIN TIME: 13.8 s (ref 11.4–15.2)

## 2017-03-25 LAB — AMMONIA: Ammonia: 18 umol/L (ref 9–35)

## 2017-03-25 LAB — LACTIC ACID, PLASMA: Lactic Acid, Venous: 1.5 mmol/L (ref 0.5–1.9)

## 2017-03-25 MED ORDER — DEXTROSE 50 % IV SOLN
1.0000 | Freq: Once | INTRAVENOUS | Status: DC
  Filled 2017-03-25: qty 50

## 2017-03-25 NOTE — ED Notes (Signed)
Patient transported to CT 

## 2017-03-25 NOTE — ED Triage Notes (Signed)
Pt arrives via EMS from home. Daughter reports pt was complaining of pain all over his body at 10 today and then at 1010, pt became unresponsive. 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 Pt responds to pain.

## 2017-03-25 NOTE — ED Notes (Signed)
This RN walked into pt room and found pt calm and sitting on the end of the bed having removed all monitor wires and his IV with catheter intact. Pt unable to tell me his location but can st name and birthday correctly. Pt reconnected to monitor and settled back in bed.

## 2017-03-25 NOTE — ED Notes (Signed)
Pt's daughter at bedside and verbalized understanding of d/c instructions.

## 2017-03-25 NOTE — ED Notes (Signed)
EDP at bedside  

## 2017-03-25 NOTE — ED Notes (Signed)
Sent label to main lab for tsh testing

## 2017-03-25 NOTE — ED Provider Notes (Signed)
[facility] Provider Note   CSN: [id number] Arrival date & time: 03/25/17  1527     History   Chief Complaint Chief Complaint  Patient presents with  . Altered Mental Status    HPI Luz Brazenathaniel Brunkow is a 83 y.o. male.  The history is provided by the patient and medical records.  Altered Mental Status   This is a new problem. The current episode started 3 to 5 hours ago. The problem has been resolved. Associated symptoms include unresponsiveness. Risk factors: elderly, dementia. Past medical history comments: dementia, prior syncope and falls.    No past medical history on file.  There are no active problems to display for this patient.   History reviewed. No pertinent surgical history.     Home Medications    Prior to Admission medications   Medication Sig Start Date End Date Taking? Authorizing Provider  acyclovir (ZOVIRAX) 400 MG tablet Take 2 tablets (800 mg total) by mouth 5 (five) times daily. Patient not taking: Reported on 09/30/2016 08/09/15   Jerre SimonFocht, Jessica L, PA  cephALEXin (KEFLEX) 500 MG capsule Take 1 capsule (500 mg total) by mouth 4 (four) times daily. Patient not taking: Reported on 09/30/2016 06/20/16   Anselm PancoastJoy, Shawn C, PA-C    Family History No family history on file.  Social History Social History   Tobacco Use  . Smoking status: Never Smoker  . Smokeless tobacco: Never Used  Substance Use Topics  . Alcohol use: No  . Drug use: No     Allergies   Patient has no known allergies.   Review of Systems Review of Systems  Unable to perform ROS: Dementia     Physical Exam Updated Vital Signs BP (!) 160/88 (BP Location: Right Arm)   Pulse 77   Temp 97.7 F (36.5 C) (Oral)   Resp 14   SpO2 100%   Physical Exam  Constitutional: He appears well-developed.  Cachectic  HENT:  Head: Normocephalic and atraumatic.  Eyes: Conjunctivae are normal. No scleral icterus.  Neck: Neck supple.  No midline  cervical TTP or stepoffs  Cardiovascular: Normal rate, regular rhythm and intact distal pulses.  No murmur heard. Pulmonary/Chest: Effort normal and breath sounds normal. No respiratory distress.  Abdominal: Soft. There is no tenderness.  Musculoskeletal: Normal range of motion. He exhibits no edema.  Neurological: He is alert.  Oriented to person, follows commands & answers questions appropriately, symmetric grip strength, able to lift & hold both LE off the bed, no focal sensory deficits  Skin: Skin is warm and dry.  Psychiatric: He has a normal mood and affect.  Nursing note and vitals reviewed.    ED Treatments / Results  Labs (all labs ordered are listed, but only abnormal results are displayed) Labs Reviewed  COMPREHENSIVE METABOLIC PANEL - Abnormal; Notable for the following components:      Result Value   Potassium 3.0 (*)    Glucose, Bld 63 (*)    BUN 22 (*)    Creatinine, Ser 1.45 (*)    Calcium 8.7 (*)    Total Protein 6.1 (*)    Albumin 3.3 (*)    ALT 13 (*)    GFR calc non Af Amer 39 (*)    GFR calc Af Amer 46 (*)    All other components within normal limits  CBC WITH DIFFERENTIAL/PLATELET - Abnormal; Notable for the following components:   WBC 3.4 (*)    RBC 3.34 (*)  Hemoglobin 10.5 (*)    HCT 32.1 (*)    All other components within normal limits  LACTIC ACID, PLASMA  PROTIME-INR  AMMONIA  TSH  URINALYSIS, COMPLETE (UACMP) WITH MICROSCOPIC  URINALYSIS, ROUTINE W REFLEX MICROSCOPIC  CBG MONITORING, ED    EKG  EKG Interpretation None       Radiology Ct Head Wo Contrast  Result Date: 03/25/2017 CLINICAL DATA:  Initial complaints of pain, then becoming unresponsive. EXAM: CT HEAD WITHOUT CONTRAST TECHNIQUE: Contiguous axial images were obtained from the base of the skull through the vertex without intravenous contrast. COMPARISON:  None. FINDINGS: Brain: No evidence for acute infarction, hemorrhage, mass lesion, hydrocephalus, or extra-axial  fluid. Generalized atrophy. Extensive hypoattenuation of the white matter, consistent with small vessel disease. Vascular: Calcification of the cavernous internal carotid arteries consistent with cerebrovascular atherosclerotic disease. No signs of intracranial large vessel occlusion. Skull: Normal. Negative for fracture or focal lesion. Sinuses/Orbits: No layering sinus fluid. BILATERAL cataract surgery. Other: None. IMPRESSION: Generalized atrophy and extensive small vessel disease. No acute intracranial findings. No evidence for intracranial large vessel occlusion. Electronically Signed   By: Elsie StainJohn T Curnes M.D.   On: 03/25/2017 16:34   Dg Chest Port 1 View  Result Date: 03/25/2017 CLINICAL DATA:  Unresponsive this morning. Pain all over. Previous smoker. EXAM: PORTABLE CHEST 1 VIEW COMPARISON:  03/06/2012 FINDINGS: Both lungs are hyperlucent and compatible with hyperinflation and probably emphysema. Heart size is within normal limits. No focal airspace disease or pulmonary edema. Negative for pneumothorax. Elevated left humeral head suggests an underlying rotator cuff tear. IMPRESSION: Chronic hyperinflation without acute cardiopulmonary disease. Electronically Signed   By: Richarda OverlieAdam  Henn M.D.   On: 03/25/2017 16:31    Procedures Procedures (including critical care time)  Medications Ordered in ED Medications  dextrose 50 % solution 50 mL (50 mLs Intravenous Not Given 03/25/17 2125)     Initial Impression / Assessment and Plan / ED Course  I have reviewed the triage vital signs and the nursing notes.  Pertinent labs & imaging results that were available during my care of the patient were reviewed by me and considered in my medical decision making (see chart for details).     Pt with h/o dementia & blindness presents with AMS. Per EMS, the Pt's daughter says at approximately 1000hrs the Pt was complaining of "pain all over" and then 10mins later he became unresponsive (daughter is not present,  cannot determine what methods were used to try and rouse the Pt). Medics report that they were called at 1500 and when they arrived, the Pt only made some mild moans when they placed him on the stretcher; they said sternal rub had no effect. Upon arrival to the ED, Pt easily aroused with noxious stimuli. He says he has no complaints, but does mention that he fell today & hit his head today. Given his dementia, the history is unreliable; will begin workup and wait for the daughter's arrival for collateral information though EMS says she provided very little to them.  VS & exam as above. EKG: NSR @ 62bpm w/normal intervals and no signs of ischemia. CT head w/NAICA. CXR w/o acute changes. Labs with multiple abnormalities, but all appear near the Pt's baseline.  On re-evaluation, Pt's daughter is present and says the Pt is at his baseline. Told her the only part of the evaluation left is a UA, and since her dad cannot provide us a sample, we would like to cath him to get one. She  did not want her father catheterized, and says that she does not think he has a urinary infection b/c he has no urinary complaints, back pain, F/C. I believe this decision is reasonable given the rest of his workup.  Explained all results to the Pt's daughter. Will discharge the Pt home. Recommending follow-up with PCP. ED return precautions provided. Pt's daughter acknowledged understanding of, and concurrence with the plan. All questions answered to her satisfaction. In stable condition at the time of discharge.  Final Clinical Impressions(s) / ED Diagnoses   Final diagnoses:  Altered mental status, unspecified altered mental status type    ED Discharge Orders    None       Forest Becker, MD 03/25/17 1610    Blane Ohara, MD 03/25/17 2352

## 2017-03-25 NOTE — ED Notes (Signed)
Portable chest x-ray at bedside at this time. 

## 2017-06-01 DEATH — deceased

## 2018-07-06 IMAGING — DX DG CHEST 1V PORT
1 series · 1 of 1 positions shown · non-contrast
Comparison: 03/06/2012

CLINICAL DATA: Unresponsive this morning. Pain all over. Previous
smoker.

EXAM:
PORTABLE CHEST 1 VIEW

[chest]
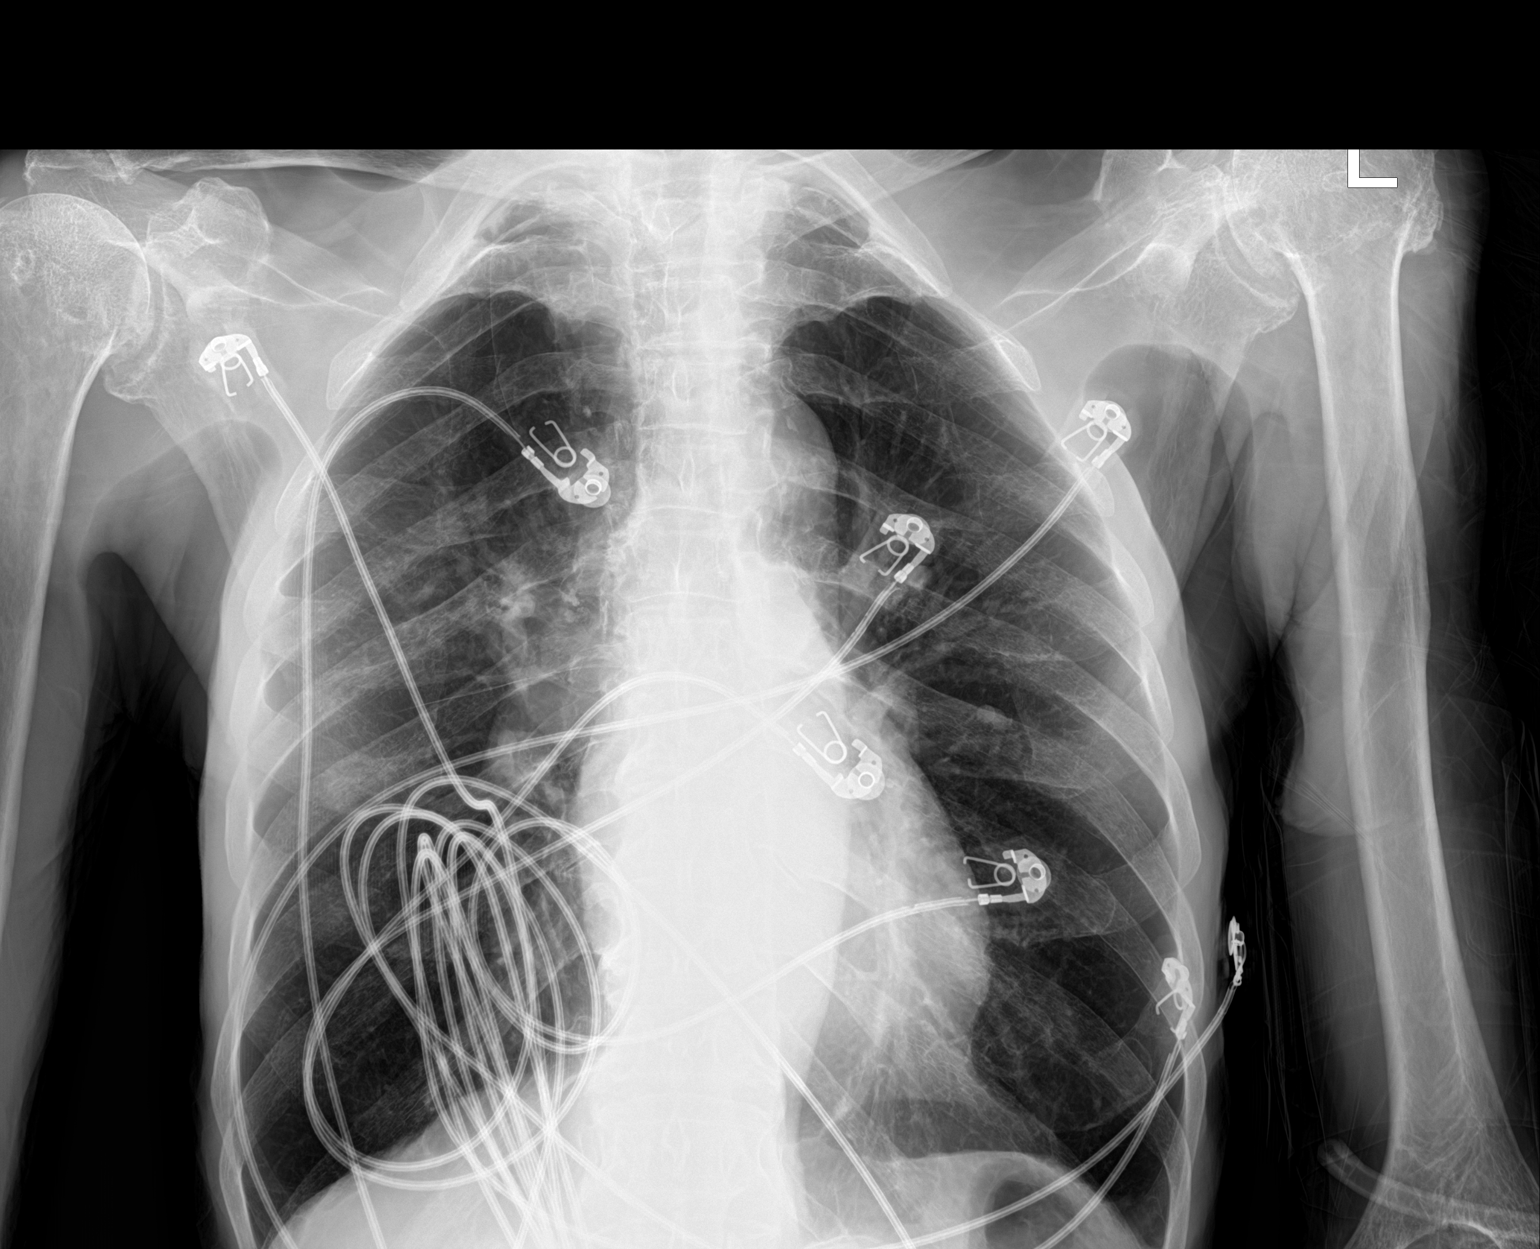

[1 of 1 positions shown; findings below may reference images not displayed]

FINDINGS: Both lungs are hyperlucent and compatible with hyperinflation and
probably emphysema. Heart size is within normal limits. No focal
airspace disease or pulmonary edema. Negative for pneumothorax.
Elevated left humeral head suggests an underlying rotator cuff tear.
IMPRESSION: Chronic hyperinflation without acute cardiopulmonary disease.
